# Patient Record
Sex: Female | Born: 1995 | Race: White | Hispanic: No | Marital: Single | Smoking: Never smoker
Health system: Southern US, Community
[De-identification: ages and names within clinical notes are randomized; demographics above are authoritative.]

---
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
## 2017-06-20 ENCOUNTER — Emergency Department: Payer: 59

## 2017-06-20 ENCOUNTER — Encounter: Payer: Self-pay | Admitting: Emergency Medicine

## 2017-06-20 ENCOUNTER — Emergency Department
Admission: EM | Admit: 2017-06-20 | Discharge: 2017-06-20 | Disposition: A | Discharge status: Home / Self Care | Payer: 59 | Attending: Emergency Medicine | Admitting: Emergency Medicine

## 2017-06-20 DIAGNOSIS — Y929 Unspecified place or not applicable: Secondary | ICD-10-CM | POA: Diagnosis not present

## 2017-06-20 DIAGNOSIS — S161XXA Strain of muscle, fascia and tendon at neck level, initial encounter: Secondary | ICD-10-CM

## 2017-06-20 DIAGNOSIS — Y999 Unspecified external cause status: Secondary | ICD-10-CM | POA: Diagnosis not present

## 2017-06-20 DIAGNOSIS — M25512 Pain in left shoulder: Secondary | ICD-10-CM | POA: Diagnosis not present

## 2017-06-20 DIAGNOSIS — Y9389 Activity, other specified: Secondary | ICD-10-CM | POA: Diagnosis not present

## 2017-06-20 DIAGNOSIS — S199XXA Unspecified injury of neck, initial encounter: Secondary | ICD-10-CM | POA: Diagnosis present

## 2017-06-20 MED ORDER — CYCLOBENZAPRINE HCL 5 MG PO TABS: 5.0000 mg | ORAL_TABLET | Freq: Three times a day (TID) | ORAL | 0 refills | Status: AC | PRN

## 2017-06-20 MED ORDER — NAPROXEN 500 MG PO TABS: 500.0000 mg | ORAL_TABLET | Freq: Two times a day (BID) | ORAL | 0 refills | Status: AC

## 2017-06-20 MED ORDER — NAPROXEN 500 MG PO TABS
500.0000 mg | ORAL_TABLET | Freq: Once | ORAL | Status: AC
  Administered 2017-06-20: 500 mg via ORAL
  Filled 2017-06-20: qty 1

## 2017-06-20 MED ORDER — CYCLOBENZAPRINE HCL 10 MG PO TABS
5.0000 mg | ORAL_TABLET | Freq: Once | ORAL | Status: DC
  Filled 2017-06-20: qty 1
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
## 2017-06-20 NOTE — Discharge Instructions (Signed)
Take prescriptions as prescribed. Continue to monitor symptoms for improvement. If symptoms do not resolve follow up with orthopedics as needed however if symptoms significantly worsen return to emergency department.

## 2017-06-20 NOTE — ED Notes (Signed)
Pt verbalized understanding of discharge instructions. NAD at this time. 
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
## 2022-07-17 ENCOUNTER — Other Ambulatory Visit (HOSPITAL_COMMUNITY)
Admission: RE | Admit: 2022-07-17 | Discharge: 2022-07-17 | Disposition: A | Discharge status: Home / Self Care | Payer: Managed Care, Other (non HMO) | Source: Ambulatory Visit | Attending: Nurse Practitioner | Admitting: Nurse Practitioner

## 2022-07-17 ENCOUNTER — Other Ambulatory Visit: Payer: Self-pay | Admitting: Nurse Practitioner

## 2022-07-17 DIAGNOSIS — Z124 Encounter for screening for malignant neoplasm of cervix: Secondary | ICD-10-CM | POA: Diagnosis not present

## 2022-07-18 ENCOUNTER — Other Ambulatory Visit: Payer: Self-pay | Admitting: Nurse Practitioner

## 2022-07-18 DIAGNOSIS — M7989 Other specified soft tissue disorders: Secondary | ICD-10-CM

## 2022-07-23 LAB — CYTOLOGY - PAP: Diagnosis: NEGATIVE

## 2022-08-01 ENCOUNTER — Other Ambulatory Visit: Payer: 59

## 2022-08-05 ENCOUNTER — Ambulatory Visit
Admission: RE | Admit: 2022-08-05 | Discharge: 2022-08-05 | Disposition: A | Discharge status: Home / Self Care | Payer: Managed Care, Other (non HMO) | Source: Ambulatory Visit | Attending: Nurse Practitioner | Admitting: Nurse Practitioner

## 2022-08-05 DIAGNOSIS — M7989 Other specified soft tissue disorders: Secondary | ICD-10-CM
